# Patient Record
Sex: Male | Born: 1980 | Race: White | Hispanic: No | Marital: Single | Smoking: Current every day smoker
Health system: Southern US, Community
[De-identification: ages and names within clinical notes are randomized; demographics above are authoritative.]

## PROBLEM LIST (undated history)

## (undated) DIAGNOSIS — J189 Pneumonia, unspecified organism: Secondary | ICD-10-CM

---

## 2011-09-04 ENCOUNTER — Encounter: Payer: Self-pay | Admitting: *Deleted

## 2011-09-04 ENCOUNTER — Emergency Department (HOSPITAL_COMMUNITY)
Admission: EM | Admit: 2011-09-04 | Discharge: 2011-09-04 | Disposition: A | Discharge status: Home / Self Care | Payer: Self-pay | Attending: Emergency Medicine | Admitting: Emergency Medicine

## 2011-09-04 ENCOUNTER — Emergency Department (HOSPITAL_COMMUNITY): Payer: Self-pay

## 2011-09-04 DIAGNOSIS — N5089 Other specified disorders of the male genital organs: Secondary | ICD-10-CM | POA: Insufficient documentation

## 2011-09-04 DIAGNOSIS — K4090 Unilateral inguinal hernia, without obstruction or gangrene, not specified as recurrent: Secondary | ICD-10-CM | POA: Insufficient documentation

## 2011-09-04 DIAGNOSIS — N509 Disorder of male genital organs, unspecified: Secondary | ICD-10-CM | POA: Insufficient documentation

## 2011-09-04 DIAGNOSIS — F17200 Nicotine dependence, unspecified, uncomplicated: Secondary | ICD-10-CM | POA: Insufficient documentation

## 2011-09-04 LAB — URINALYSIS, ROUTINE W REFLEX MICROSCOPIC
Glucose, UA: NEGATIVE mg/dL
Ketones, ur: NEGATIVE mg/dL
Leukocytes, UA: NEGATIVE
Nitrite: NEGATIVE
Specific Gravity, Urine: 1.008 (ref 1.005–1.030)
pH: 6.5 (ref 5.0–8.0)

## 2011-09-04 NOTE — ED Notes (Signed)
Dr. Radford Pax made aware re pt's complaint.  Okayed for pt to be sent to waiting area at this time. Okay to be down graded to urgent (3)

## 2011-09-04 NOTE — ED Notes (Signed)
Pt reports L testicular pain and swollen area since Saturday.  Pt denies injury.  Pt reports pain when attempting to urinate or have a BM.

## 2011-09-04 NOTE — ED Notes (Signed)
Awaiting [facility].  Have tried to call w/o answer to the 2 numbers listed for that dept.  Unit secretary was able to get a hold of an [facility] tech who will be arriving w/i .

## 2011-09-05 NOTE — ED Provider Notes (Signed)
History     CSN: [id number] Arrival date & time: 09/04/2011  2:07 PM   First MD Initiated Contact with Patient 09/04/11 1940      Chief Complaint  Patient presents with  . Testicle Pain    (Consider location/radiation/quality/duration/timing/severity/associated sxs/prior treatment) Patient is a 29 y.o. male presenting with testicular pain. The history is provided by the patient.  Testicle Pain The current episode started in the past 7 days. The problem occurs intermittently. The problem has been unchanged. Pertinent negatives include no abdominal pain, chills, fever, nausea, swollen glands, urinary symptoms or weakness. The symptoms are aggravated by standing, coughing, sneezing and bending. He has tried nothing for the symptoms.  Pt states he leaned forward 5 days ago and felt a pain and a 'pop' in left testicle. States since then on and off pain and swelling. Denies abdominal pain, denies nausea, vomiting, urinary symptoms. Normal bowel movements.  Past Medical History  Diagnosis Date  . Asthma     History reviewed. No pertinent past surgical history.  No family history on file.  History  Substance Use Topics  . Smoking status: Current Everyday Smoker -- 0.5 packs/day  . Smokeless tobacco: Not on file  . Alcohol Use: Yes     rarely      Review of Systems  Constitutional: Negative for fever and chills.  HENT: Negative.   Eyes: Negative.   Respiratory: Negative.   Cardiovascular: Negative.   Gastrointestinal: Negative.  Negative for nausea and abdominal pain.  Genitourinary: Positive for testicular pain.  Musculoskeletal: Negative.   Neurological: Negative.  Negative for weakness.  Hematological: Negative.   Psychiatric/Behavioral: Negative.     Allergies  Penicillins  Home Medications   Current Outpatient Rx  Name Route Sig Dispense Refill  . NABUMETONE 750 MG PO TABS Oral Take 750 mg by mouth 2 (two) times daily as needed. Pain      . NAPROXEN SODIUM  220 MG PO TABS Oral Take 880 mg by mouth 2 (two) times daily as needed. Pain        BP 112/78  Pulse 88  Temp(Src) 98 F (36.7 C) (Oral)  Resp 16  Wt 130 lb (58.968 kg)  SpO2 99%  Physical Exam  Constitutional: He is oriented to person, place, and time. He appears well-developed and well-nourished.  HENT:  Head: Normocephalic and atraumatic.  Neck: Neck supple.  Cardiovascular: Normal rate, regular rhythm and normal heart sounds.   Pulmonary/Chest: Effort normal and breath sounds normal.  Abdominal: Soft. Bowel sounds are normal. There is no tenderness. A hernia is present. Hernia confirmed positive in the left inguinal area. Hernia confirmed negative in the right inguinal area.  Genitourinary: Testes normal and penis normal. Cremasteric reflex is present. Right testis shows no mass, no swelling and no tenderness. Left testis shows no mass, no swelling and no tenderness. Circumcised.  Musculoskeletal: Normal range of motion.  Lymphadenopathy:       Left: No inguinal adenopathy present.  Neurological: He is alert and oriented to person, place, and time.  Skin: Skin is warm and dry.    ED Course  Procedures (including critical care time)   Labs Reviewed  URINALYSIS, ROUTINE W REFLEX MICROSCOPIC   US Scrotum  09/04/2011  *RADIOLOGY REPORT*  Clinical Data: Left testicular pain and swelling.  SCROTAL ULTRASOUND DOPPLER ULTRASOUND OF THE TESTICLES  Technique:  Complete ultrasound examination of the testicles, epididymis, and other scrotal structures was performed.  Color and spectral Doppler ultrasound were also utilized to  evaluate blood flow to the testicles.  Comparison:  None.  Findings:  The testicles are symmetric in size and echogenicity. The right testis measures 4.2 x 2.5 x 2.6 cm, while the left testis measures 4.4 x 1.9 x 2.9 cm.  No testicular masses are seen, and there is no evidence of microlithiasis.  Both epididymal heads are unremarkable in appearance.  There is no  evidence of hydrocele, varicocele, or other extra-testicular abnormality.  Blood flow is seen within both testicles on color Doppler sonography.  Doppler spectral waveforms show both arterial and venous flow signal in both testicles.  IMPRESSION: Negative.  No evidence of testicular mass or torsion.  Original Report Authenticated By: Tonia Ghent, M.D.   [place] Art/ven Flow Abd Pelv Doppler  09/04/2011  *RADIOLOGY REPORT*  Clinical Data: Left testicular pain and swelling.  SCROTAL ULTRASOUND DOPPLER ULTRASOUND OF THE TESTICLES  Technique:  Complete ultrasound examination of the testicles, epididymis, and other scrotal structures was performed.  Color and spectral Doppler ultrasound were also utilized to evaluate blood flow to the testicles.  Comparison:  None.  Findings:  The testicles are symmetric in size and echogenicity. The right testis measures 4.2 x 2.5 x 2.6 cm, while the left testis measures 4.4 x 1.9 x 2.9 cm.  No testicular masses are seen, and there is no evidence of microlithiasis.  Both epididymal heads are unremarkable in appearance.  There is no evidence of hydrocele, varicocele, or other extra-testicular abnormality.  Blood flow is seen within both testicles on color Doppler sonography.  Doppler spectral waveforms show both arterial and venous flow signal in both testicles.  IMPRESSION: Negative.  No evidence of testicular mass or torsion.  Original Report Authenticated By: Tonia Ghent, M.D.   [place] negative. UA negative. Will d/c home.  1. Inguinal hernia, left       MDM          Lottie Mussel, PA 09/05/11 0145

## 2011-09-05 NOTE — ED Provider Notes (Signed)
Medical screening examination/treatment/procedure(s) were performed by non-physician practitioner and as supervising physician I was immediately available for consultation/collaboration.  Nicholes Stairs, MD 09/05/11 1440

## 2012-11-08 IMAGING — US US SCROTUM
1 series · 14 of 25 positions shown · non-contrast
Comparison: None.

CLINICAL DATA: Left testicular pain and swelling.

SCROTAL ULTRASOUND
DOPPLER ULTRASOUND OF THE TESTICLES
TECHNIQUE: Complete ultrasound examination of the testicles,
epididymis, and other scrotal structures was performed.  Color and
spectral Doppler ultrasound were also utilized to evaluate blood
flow to the testicles.

[Series 1: us scrotum · 0.08mm/px · 51 acquisitions, 14 frames shown]
[im 1/51]
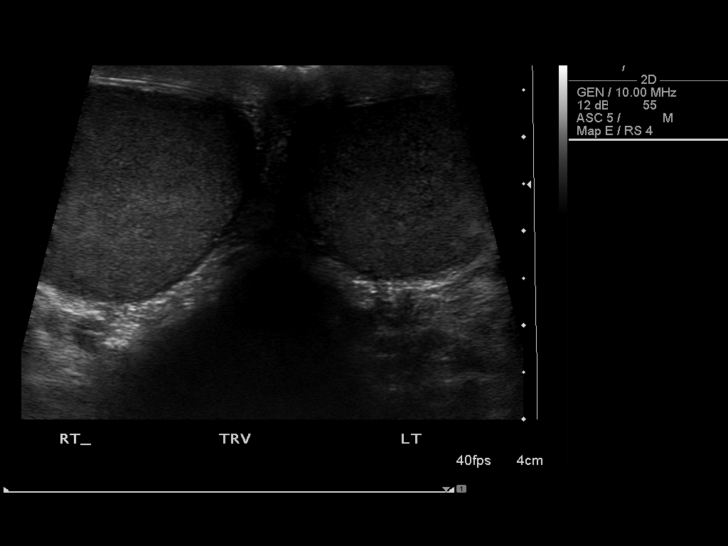
[im 5/51]
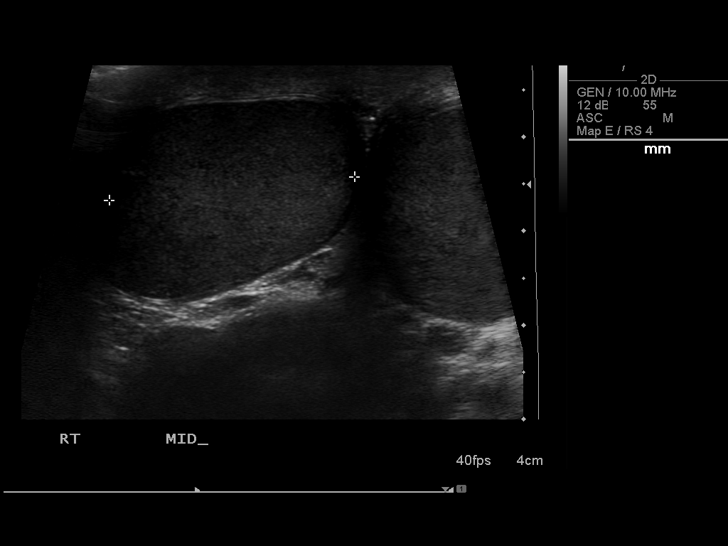
[im 9/51]
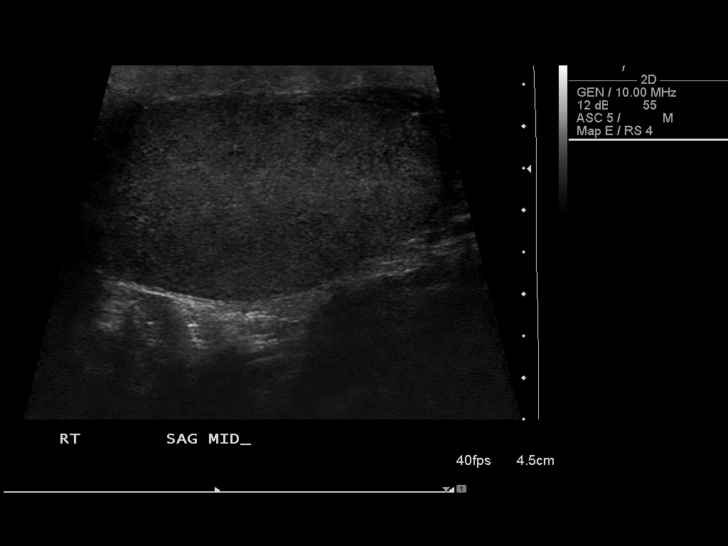
[im 13/51]
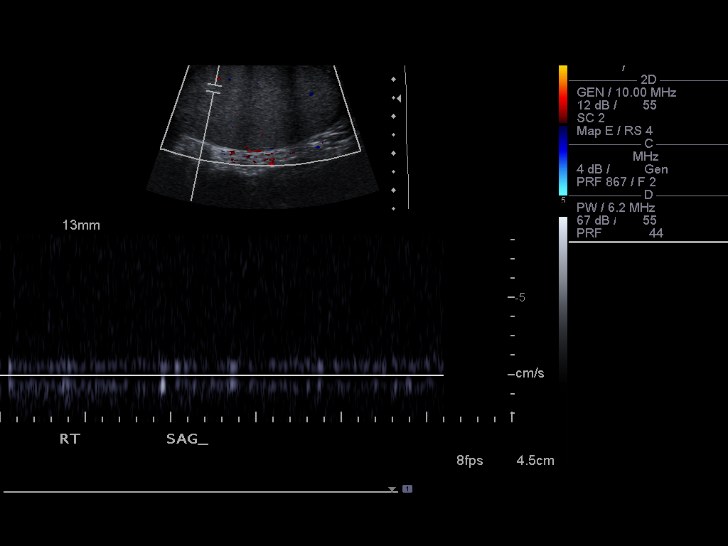
[im 17/51]
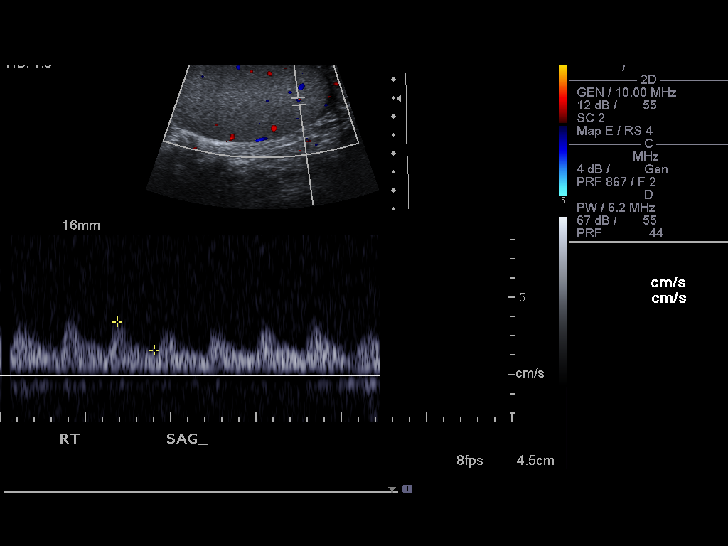
[im 19/51]
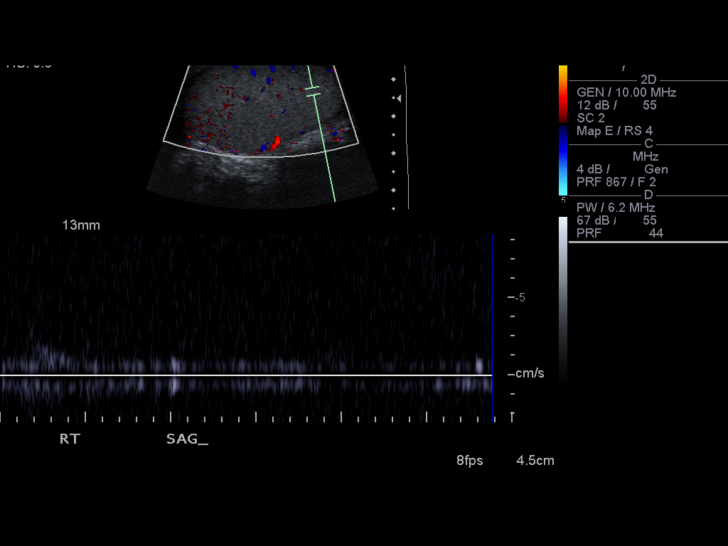
[im 23/51]
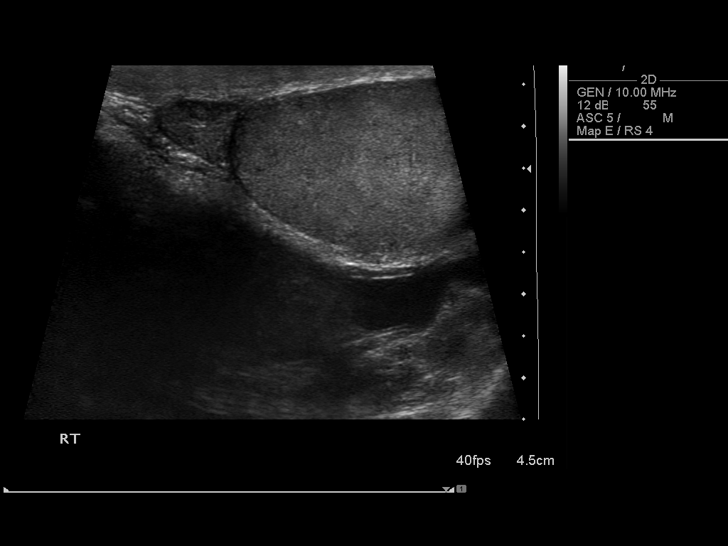
[im 28/51]
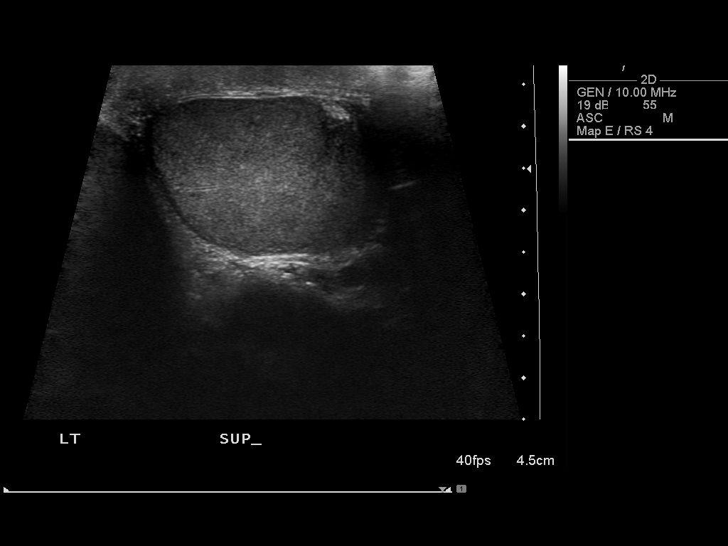
[im 32/51]
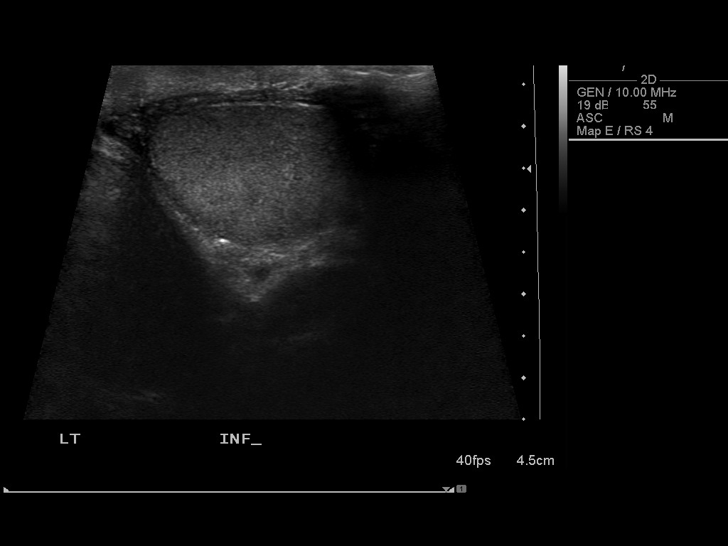
[im 34/51]
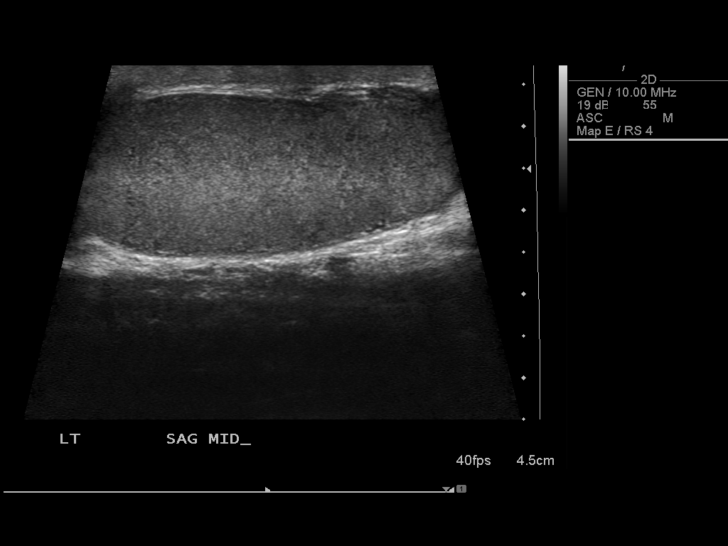
[im 38/51]
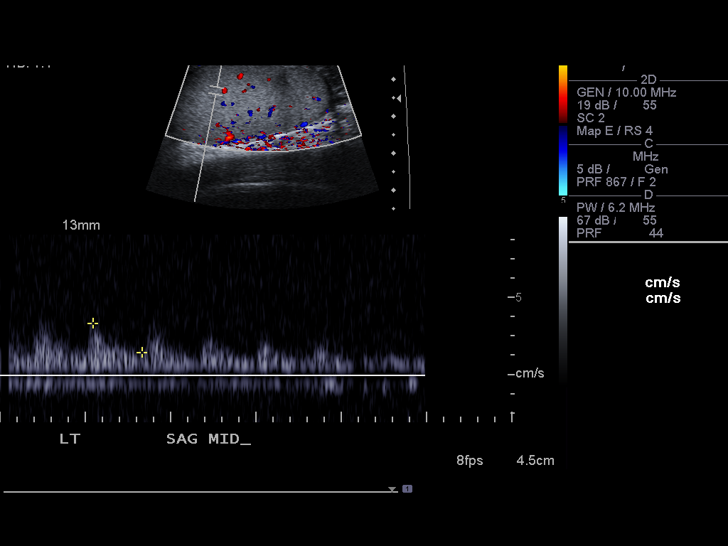
[im 42/51]
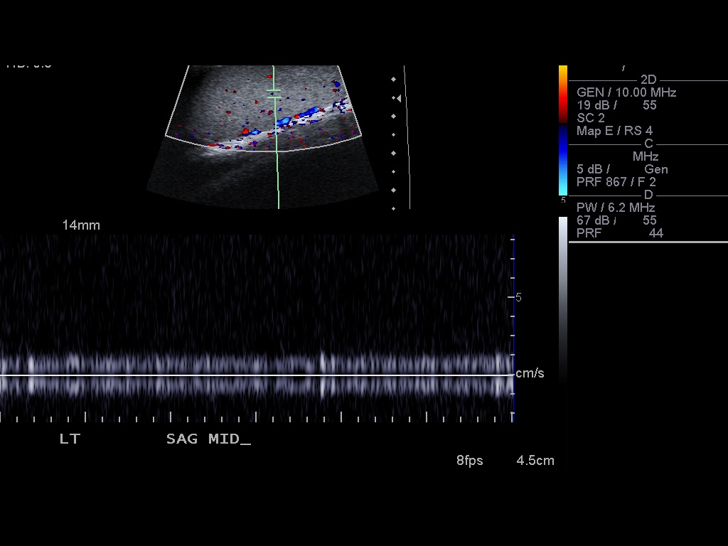
[im 46/51]
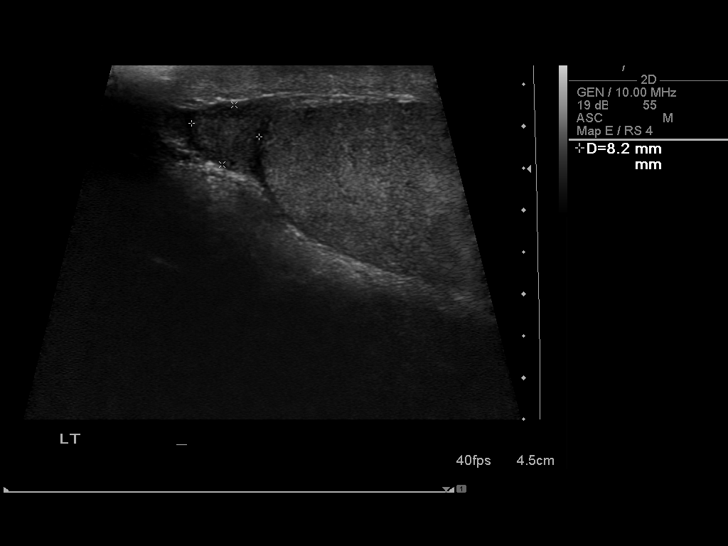
[im 51/51]
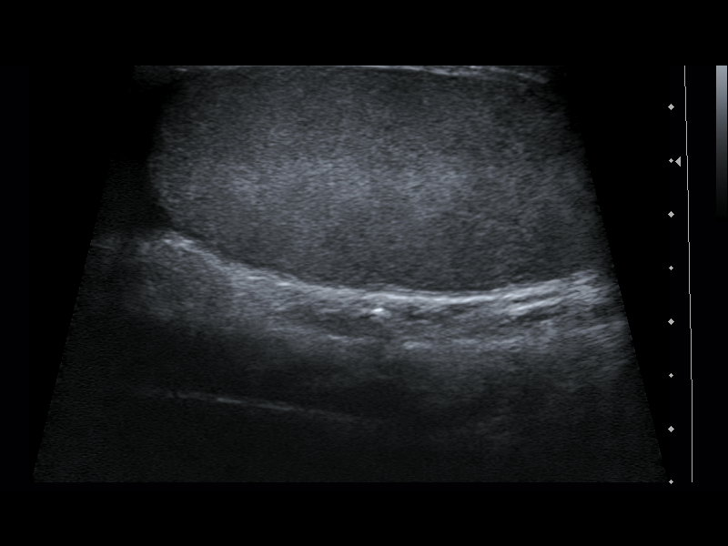

[14 of 25 positions shown; findings below may reference images not displayed]

FINDINGS: The testicles are symmetric in size and echogenicity.
The right testis measures 4.2 x 2.5 x 2.6 cm, while the left testis
measures 4.4 x 1.9 x 2.9 cm.  No testicular masses are seen, and
there is no evidence of microlithiasis.  Both epididymal heads are
unremarkable in appearance.  There is no evidence of hydrocele,
varicocele, or other extra-testicular abnormality.

Blood flow is seen within both testicles on color Doppler
sonography.  Doppler spectral waveforms show both arterial and
venous flow signal in both testicles.
IMPRESSION: Negative.  No evidence of testicular mass or torsion.

## 2020-02-01 ENCOUNTER — Ambulatory Visit
Admission: EM | Admit: 2020-02-01 | Discharge: 2020-02-01 | Disposition: A | Discharge status: Home / Self Care | Payer: Self-pay | Attending: Physician Assistant | Admitting: Physician Assistant

## 2020-02-01 DIAGNOSIS — F17200 Nicotine dependence, unspecified, uncomplicated: Secondary | ICD-10-CM

## 2020-02-01 DIAGNOSIS — J4521 Mild intermittent asthma with (acute) exacerbation: Secondary | ICD-10-CM

## 2020-02-01 HISTORY — DX: Pneumonia, unspecified organism: J18.9

## 2020-02-01 MED ORDER — ALBUTEROL SULFATE (2.5 MG/3ML) 0.083% IN NEBU: 2.5000 mg | INHALATION_SOLUTION | Freq: Four times a day (QID) | RESPIRATORY_TRACT | 0 refills | Status: AC | PRN

## 2020-02-01 MED ORDER — PREDNISONE 50 MG PO TABS: 50.0000 mg | ORAL_TABLET | Freq: Every day | ORAL | 0 refills | Status: AC

## 2020-02-01 MED ORDER — ALBUTEROL SULFATE HFA 108 (90 BASE) MCG/ACT IN AERS
2.0000 | INHALATION_SPRAY | Freq: Once | RESPIRATORY_TRACT | Status: AC
  Administered 2020-02-01: 18:00:00 2 via RESPIRATORY_TRACT

## 2020-02-01 MED ORDER — DEXAMETHASONE SODIUM PHOSPHATE 10 MG/ML IJ SOLN
10.0000 mg | Freq: Once | INTRAMUSCULAR | Status: AC
  Administered 2020-02-01: 17:00:00 10 mg via INTRAMUSCULAR

## 2020-02-01 MED ORDER — IPRATROPIUM-ALBUTEROL 0.5-2.5 (3) MG/3ML IN SOLN: 3.0000 mL | Freq: Four times a day (QID) | RESPIRATORY_TRACT | 0 refills | Status: AC | PRN

## 2020-02-01 NOTE — ED Provider Notes (Signed)
[facility]    CSN: [id number] Arrival date & time: 02/01/20  1636      History   Chief Complaint Chief Complaint  Patient presents with  . Shortness of Breath    HPI Kenneth Hines is a 39 y.o. male.   39 year old male comes in for 2-3 days of shortness of breath, wheezing. Had URI symptoms 2 weeks before, but resolved prior to current symptom onset. Denies fever, chills, body aches. Denies abdominal pain, nausea, vomiting, diarrhea. Denies loss of taste/smell. Current every day smoker, 7.5 pack year history.     Past Medical History:  Diagnosis Date  . Asthma   . PNA (pneumonia)     There are no problems to display for this patient.   History reviewed. No pertinent surgical history.     Home Medications    Prior to Admission medications   Medication Sig Start Date End Date Taking? Authorizing Provider  albuterol (PROVENTIL) (2.5 MG/3ML) 0.083% nebulizer solution Take 3 mLs (2.5 mg total) by nebulization every 6 (six) hours as needed for wheezing or shortness of breath. 02/01/20   Tasia Catchings, Masao Junker V, PA-C  ipratropium-albuterol (DUONEB) 0.5-2.5 (3) MG/3ML SOLN Take 3 mLs by nebulization every 6 (six) hours as needed. 02/01/20   Ok Edwards, PA-C  predniSONE (DELTASONE) 50 MG tablet Take 1 tablet (50 mg total) by mouth daily with breakfast. 02/01/20   Ok Edwards, PA-C    Family History History reviewed. No pertinent family history.  Social History Social History   Tobacco Use  . Smoking status: Current Every Day Smoker    Packs/day: 0.50  . Smokeless tobacco: Never Used  Substance Use Topics  . Alcohol use: Not Currently  . Drug use: No     Allergies   Penicillins   Review of Systems Review of Systems  Reason unable to perform ROS: See HPI as above.     Physical Exam Triage Vital Signs ED Triage Vitals  Enc Vitals Group     BP 02/01/20 1642 113/80     Pulse Rate 02/01/20 1642 98     Resp 02/01/20 1642 20     Temp 02/01/20 1642 98.2 F (36.8  C)     Temp Source 02/01/20 1642 Oral     SpO2 02/01/20 1642 91 %     Weight --      Height --      Head Circumference --      Peak Flow --      Pain Score 02/01/20 1644 0     Pain Loc --      Pain Edu? --      Excl. in [place]? --    No data found.  Updated Vital Signs BP 113/80 (BP Location: Left Arm)   Pulse 98   Temp 98.2 F (36.8 C) (Oral)   Resp 20   SpO2 91%   Physical Exam Constitutional:      General: He is not in acute distress.    Appearance: Normal appearance. He is not ill-appearing, toxic-appearing or diaphoretic.  HENT:     Head: Normocephalic and atraumatic.     Mouth/Throat:     Mouth: Mucous membranes are moist.     Pharynx: Oropharynx is clear. Uvula midline.  Cardiovascular:     Rate and Rhythm: Normal rate and regular rhythm.     Heart sounds: Normal heart sounds. No murmur. No friction rub. No gallop.   Pulmonary:     Effort: No prolonged  expiration or retractions.     Comments: Speaking in short sentences, increased effort with accessory muscle use. Lungs with diffuse inspiratory and expiratory wheezing, decreased air movement. Musculoskeletal:     Cervical back: Normal range of motion and neck supple.  Neurological:     General: No focal deficit present.     Mental Status: He is alert and oriented to person, place, and time.      UC Treatments / Results  Labs (all labs ordered are listed, but only abnormal results are displayed) Labs Reviewed - No data to display  EKG   Radiology No results found.  Procedures Procedures (including critical care time)  Medications Ordered in UC Medications  dexamethasone (DECADRON) injection 10 mg (10 mg Intramuscular Given 02/01/20 1716)    Initial Impression / Assessment and Plan / UC Course  I have reviewed the triage vital signs and the nursing notes.  Pertinent labs & imaging results that were available during my care of the patient were reviewed by me and considered in my medical decision  making (see chart for details).    4 puffs albuterol x 1: significant improvement of symptoms, O2 sat 95%. Lungs still with diffuse inspiratory and expiratory wheezing, but with much improved air movement.   4 puffs albuterol x 2: O2 sat remains 96-98%. Lungs with expiratory wheezing, good air movement.   Much improved symptoms and exam after albuterol.  Patient without other URI symptoms, fever, loss of taste or smell, will defer Covid testing at this time.  He works in [employer] laboratory technician, ?  Allergies/activity triggering asthma.  Discussed may also have component of COPD given smoking history.  Will provide Decadron injection in office today.  Provided nebulizer machine, to do DuoNeb/albuterol as needed.  Start prednisone as directed.  If patient develops other URI symptoms, fever, loss of taste or smell, to return for Covid testing.  Smoking cessation discussed.  Strict return precautions given.  Patient expresses understanding and agrees to plan.   Final Clinical Impressions(s) / UC Diagnoses   Final diagnoses:  Mild intermittent asthma with acute exacerbation   ED Prescriptions    Medication Sig Dispense Auth. Provider   predniSONE (DELTASONE) 50 MG tablet Take 1 tablet (50 mg total) by mouth daily with breakfast. 5 tablet Kratos Ruscitti V, PA-C   albuterol (PROVENTIL) (2.5 MG/3ML) 0.083% nebulizer solution Take 3 mLs (2.5 mg total) by nebulization every 6 (six) hours as needed for wheezing or shortness of breath. 75 mL Jason Frisbee V, PA-C   ipratropium-albuterol (DUONEB) 0.5-2.5 (3) MG/3ML SOLN Take 3 mLs by nebulization every 6 (six) hours as needed. 6 mL Belinda Fisher, PA-C     PDMP not reviewed this encounter.   Belinda Fisher, PA-C 02/01/20 1727

## 2020-02-01 NOTE — ED Triage Notes (Signed)
Pt c/o SOB x2-3 days. Pt has autible wheezing, SOB, speaking in short phrases. Pt states his PCP sent in a rx for his inhaler but can't get it for 4 days. Pt states hx of PNA.

## 2020-02-01 NOTE — Discharge Instructions (Signed)
Decadron injection in office today. Start prednisone as directed. Do 1-2 duoneb treatments as needed. Otherwise albuterol inhaler/nebulizer solution every 4-6 hours as needed. If develop cold symptoms such as cough, fever, body aches, may need COVID testing. If symptoms worsens on medicine, go to the ED for further evaluation needed.
